# Patient Record
Sex: Female | Born: 1995 | Race: Black or African American | Hispanic: No | Marital: Single | Smoking: Never smoker
Health system: Southern US, Community
[De-identification: ages and names within clinical notes are randomized; demographics above are authoritative.]

---

## 2017-02-27 ENCOUNTER — Emergency Department (HOSPITAL_COMMUNITY)
Admission: EM | Admit: 2017-02-27 | Discharge: 2017-02-27 | Disposition: A | Discharge status: Home / Self Care | Payer: Medicaid Other | Attending: Emergency Medicine | Admitting: Emergency Medicine

## 2017-02-27 ENCOUNTER — Emergency Department (HOSPITAL_COMMUNITY): Payer: Medicaid Other

## 2017-02-27 ENCOUNTER — Encounter (HOSPITAL_COMMUNITY): Payer: Self-pay | Admitting: Emergency Medicine

## 2017-02-27 DIAGNOSIS — Y999 Unspecified external cause status: Secondary | ICD-10-CM | POA: Diagnosis not present

## 2017-02-27 DIAGNOSIS — Y929 Unspecified place or not applicable: Secondary | ICD-10-CM | POA: Insufficient documentation

## 2017-02-27 DIAGNOSIS — S6992XA Unspecified injury of left wrist, hand and finger(s), initial encounter: Secondary | ICD-10-CM | POA: Diagnosis present

## 2017-02-27 DIAGNOSIS — Y939 Activity, unspecified: Secondary | ICD-10-CM | POA: Diagnosis not present

## 2017-02-27 DIAGNOSIS — W230XXA Caught, crushed, jammed, or pinched between moving objects, initial encounter: Secondary | ICD-10-CM | POA: Insufficient documentation

## 2017-02-27 DIAGNOSIS — S60012A Contusion of left thumb without damage to nail, initial encounter: Secondary | ICD-10-CM | POA: Diagnosis not present

## 2017-02-27 NOTE — ED Provider Notes (Signed)
[facility] Provider Note   CSN: [id number] Arrival date & time: 02/27/17  0205     History   Chief Complaint Chief Complaint  Patient presents with  . Finger Injury    HPI Yvette Long is a 21 y.o. female.  HPI Patient is an 21 year old female who presents to the emergency department with complaints of left thumb pain after slamming her left thumb in a door.  This occurred approximately 6 hours ago.  She presents complaining of mild pain that is worse with range of motion and palpation of her left thumb.  No other injury.  No trauma.  No fevers or chills.   History reviewed. No pertinent past medical history.  There are no active problems to display for this patient.   History reviewed. No pertinent surgical history.  OB History    No data available       Home Medications    Prior to Admission medications   Not on File    Family History History reviewed. No pertinent family history.  Social History Social History   Tobacco Use  . Smoking status: Never Smoker  . Smokeless tobacco: Never Used  Substance Use Topics  . Alcohol use: No    Frequency: Never  . Drug use: No     Allergies   Patient has no allergy information on record.   Review of Systems Review of Systems  All other systems reviewed and are negative.    Physical Exam Updated Vital Signs BP (!) 143/71 (BP Location: Right Arm)   Pulse 69   Resp 12   LMP 02/13/2017   SpO2 100%   Physical Exam  Constitutional: She is oriented to person, place, and time. She appears well-developed and well-nourished.  HENT:  Head: Normocephalic.  Eyes: EOM are normal.  Neck: Normal range of motion.  Pulmonary/Chest: Effort normal.  Abdominal: She exhibits no distension.  Musculoskeletal: Normal range of motion.  Mild tenderness of the distal phalanx of left thumb.  Small subungual hematoma of the left nail.  Full range of motion of left thumb IP joint   Neurological: She is alert and oriented to person, place, and time.  Psychiatric: She has a normal mood and affect.  Nursing note and vitals reviewed.    ED Treatments / Results  Labs (all labs ordered are listed, but only abnormal results are displayed) Labs Reviewed - No data to display  EKG  EKG Interpretation None       Radiology Dg Finger Thumb Left  Result Date: 02/27/2017 CLINICAL DATA:  Smashed thumb in car door, with pain at the left first distal phalanx. Initial encounter. EXAM: LEFT THUMB 2+V COMPARISON:  None. FINDINGS: There is no evidence of fracture or dislocation. Visualized joint spaces are preserved. No definite soft abnormalities are characterized. No radiopaque foreign bodies are seen. IMPRESSION: No evidence of fracture or dislocation. Electronically Signed   By: Roanna RaiderJeffery  Chang M.D.   On: 02/27/2017 02:52    Procedures Procedures (including critical care time)  Medications Ordered in ED Medications - No data to display   Initial Impression / Assessment and Plan / ED Course  I have reviewed the triage vital signs and the nursing notes.  Pertinent labs & imaging results that were available during my care of the patient were reviewed by me and considered in my medical decision making (see chart for details).     Contusion.  Pain improved at this time.  I think the subungual  hematoma is too small to trephinate  Final Clinical Impressions(s) / ED Diagnoses   Final diagnoses:  Contusion of left thumb without damage to nail, initial encounter    ED Discharge Orders    None       Azalia Bilisampos, Pattricia Weiher, MD 02/27/17 [id number]

## 2017-02-27 NOTE — ED Triage Notes (Signed)
Pt presents to ED for assessment after slamming her left thumb in a sliding glass door approx 6 hours ago.  Pt unable to sleep due to pain.  Pt's mother gave patient "something for pain" prior to arrival, pt unsure of what it was.  Bruising noted to thumb bed.  Full motion and sensation intact.

## 2019-04-17 IMAGING — CR DG FINGER THUMB 2+V*L*
3 series · 3 of 3 positions shown · non-contrast
Comparison: None.

CLINICAL DATA: Smashed thumb in car door, with pain at the left
first distal phalanx. Initial encounter.

EXAM:
LEFT THUMB 2+V

[finger ap]
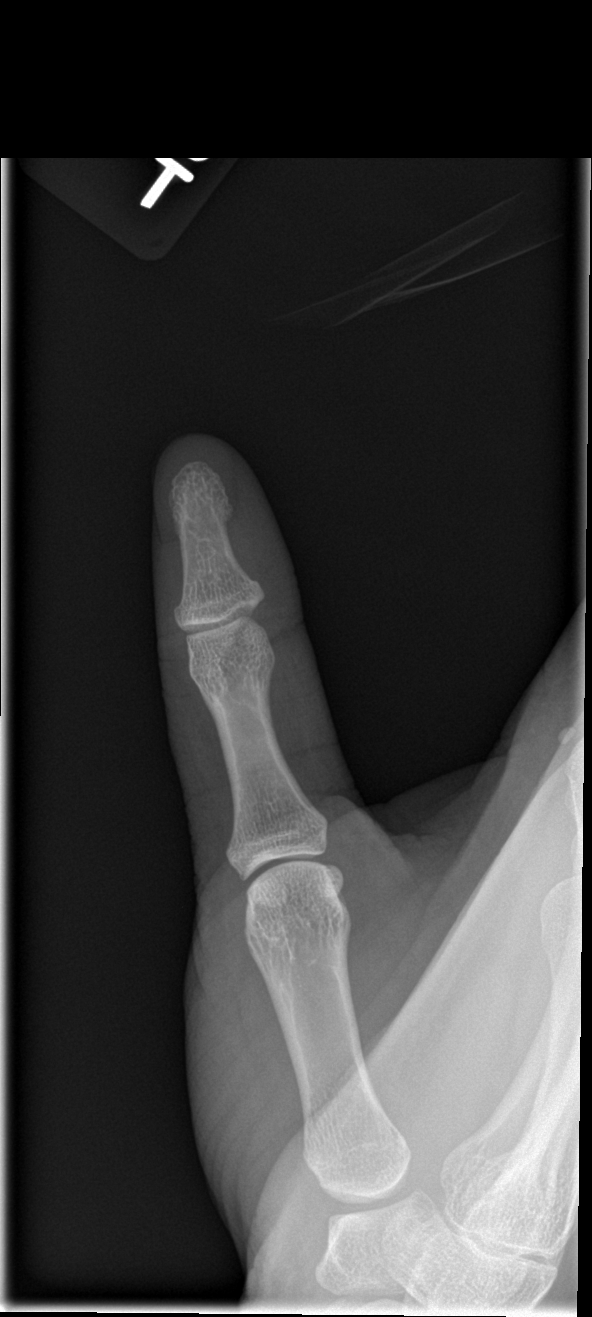

[finger obl]
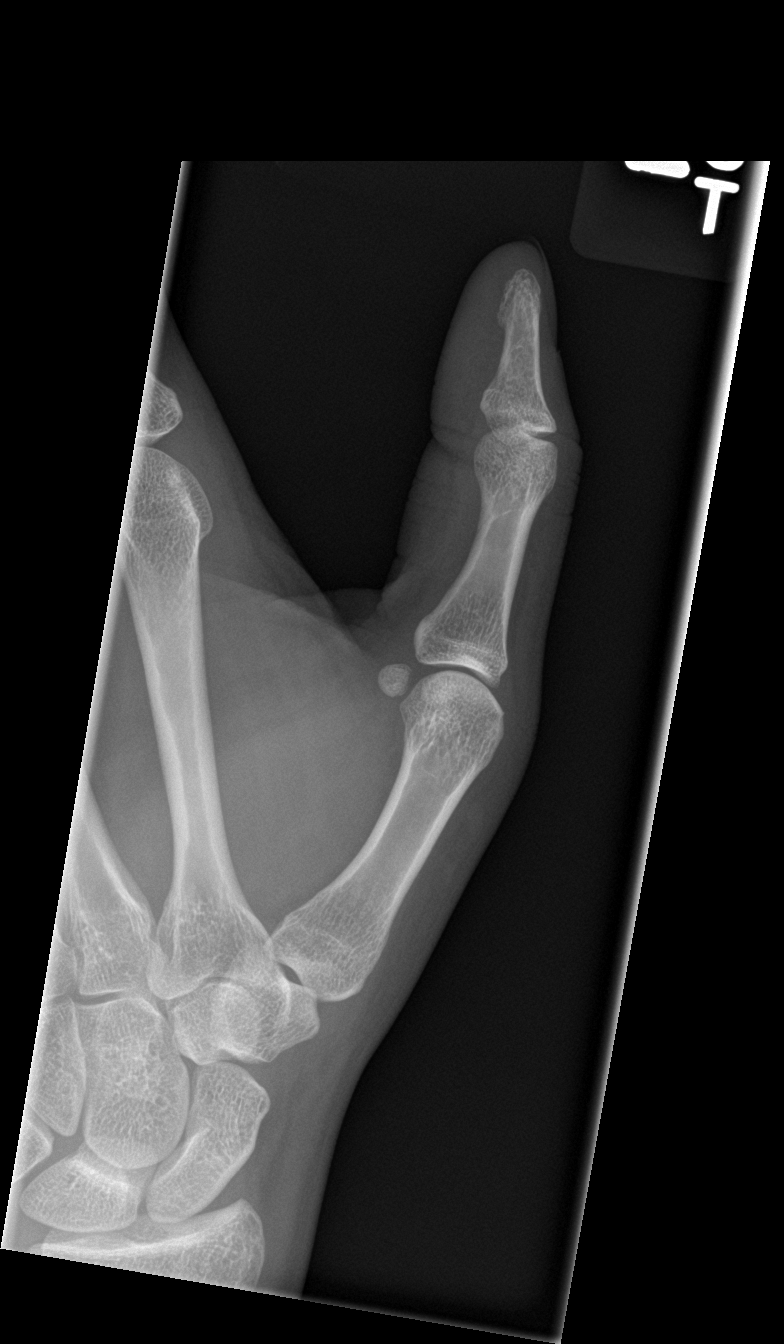

[finger lat]
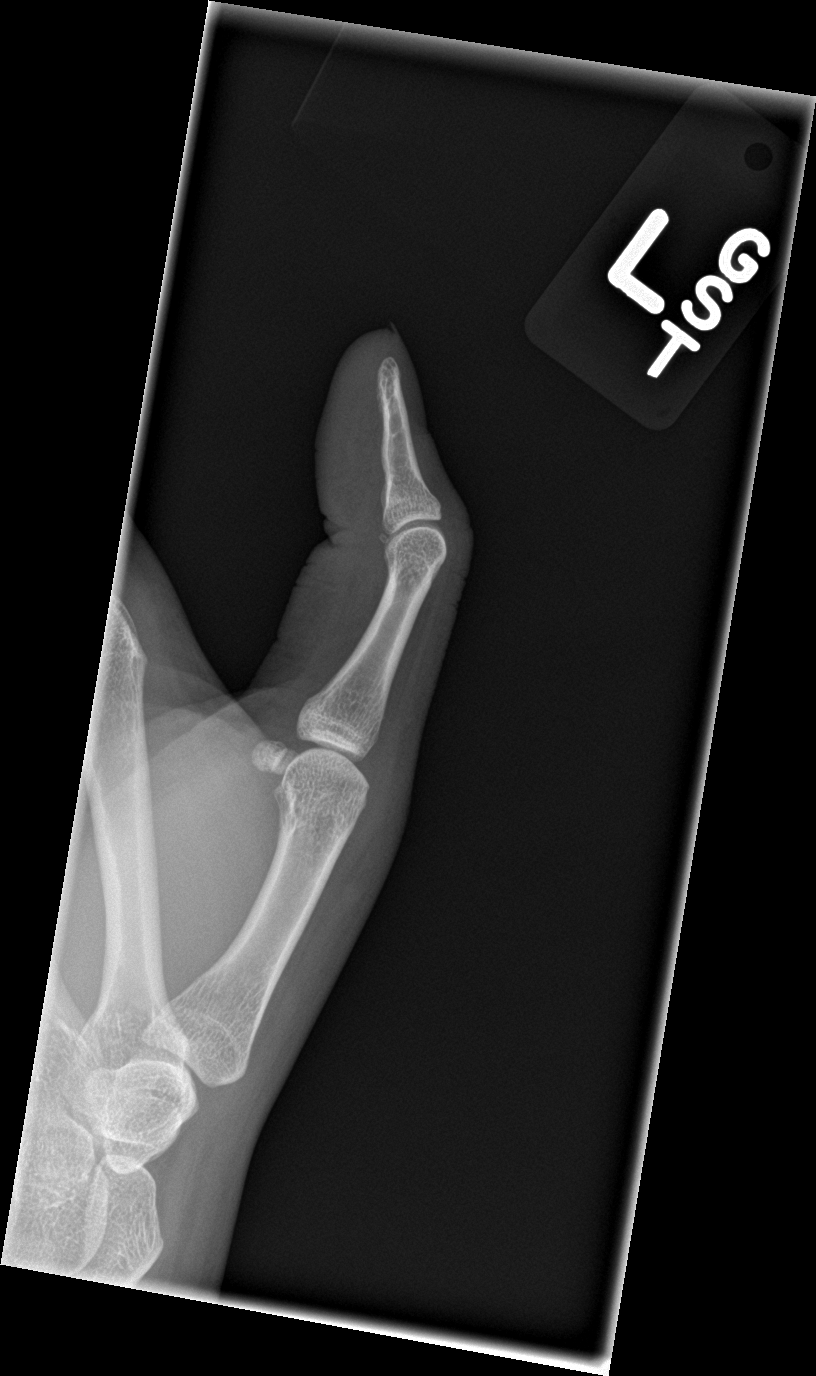

[3 of 3 positions shown; findings below may reference images not displayed]

FINDINGS: There is no evidence of fracture or dislocation. Visualized joint
spaces are preserved. No definite soft abnormalities are
characterized. No radiopaque foreign bodies are seen.
IMPRESSION: No evidence of fracture or dislocation.
